# Patient Record
Sex: Male | Born: 1965 | Hispanic: No | Marital: Married | Smoking: Never smoker
Health system: Southern US, Community
[De-identification: ages and names within clinical notes are randomized; demographics above are authoritative.]

## PROBLEM LIST (undated history)

## (undated) DIAGNOSIS — E7800 Pure hypercholesterolemia, unspecified: Secondary | ICD-10-CM

## (undated) DIAGNOSIS — I10 Essential (primary) hypertension: Secondary | ICD-10-CM

---

## 2008-01-03 ENCOUNTER — Ambulatory Visit (HOSPITAL_COMMUNITY): Admission: RE | Admit: 2008-01-03 | Discharge: 2008-01-03 | Payer: Self-pay | Admitting: Family Medicine

## 2009-07-01 IMAGING — CT CT ABDOMEN W/O CM
2 of 4 series · 17 of 46 positions shown, 19 images · non-contrast
Comparison: None

CT ABDOMEN

CLINICAL DATA: Abdominal pain.  Acute right flank pain.  Hematuria

CT ABDOMEN AND PELVIS WITHOUT CONTRAST
TECHNIQUE: Multidetector CT imaging of the abdomen and pelvis was
performed following the standard
protocol without intravenous contrast.

[Series 2: renal stone · axial · 0.87mm/px · z∈[-474,-24]mm · 14 of 96 slices shown, 16 images]
[im 4/96  soft-tissue]
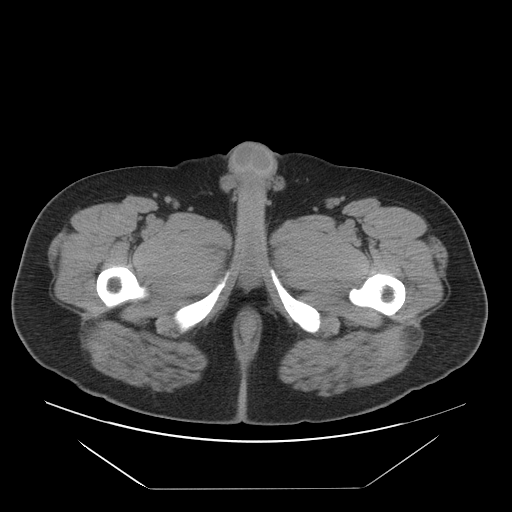
[im 4/96  bone]
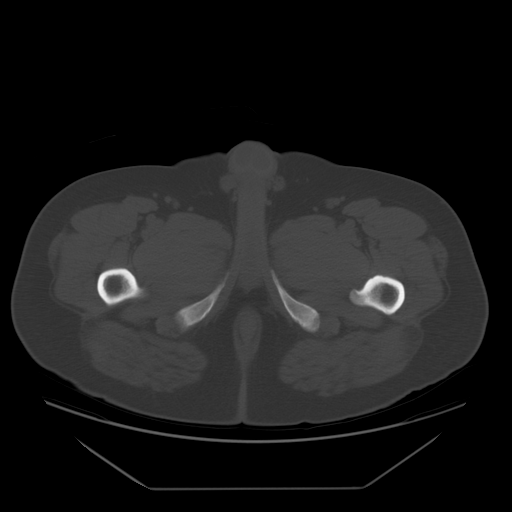
[im 12/96  soft-tissue]
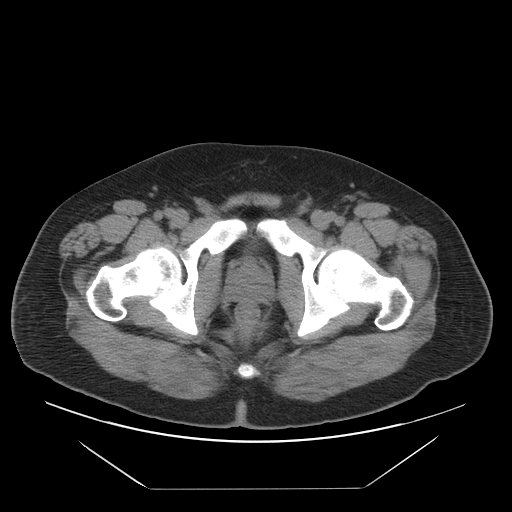
[im 20/96  soft-tissue]
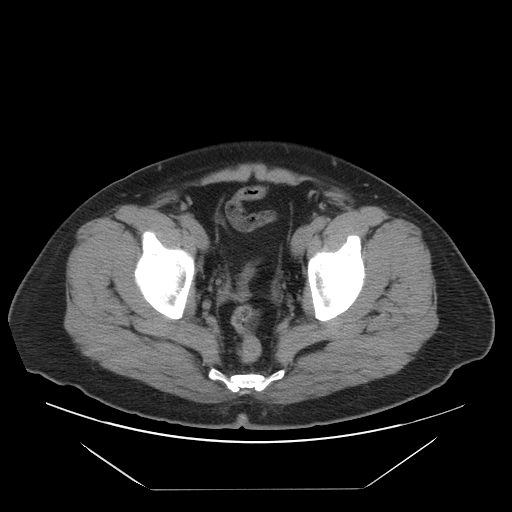
[im 24/96  soft-tissue]
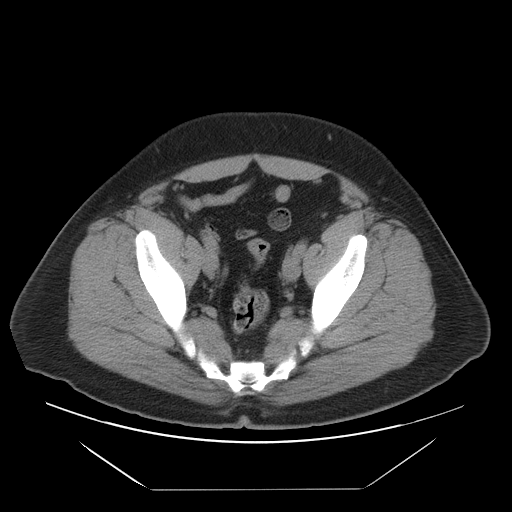
[im 32/96  soft-tissue]
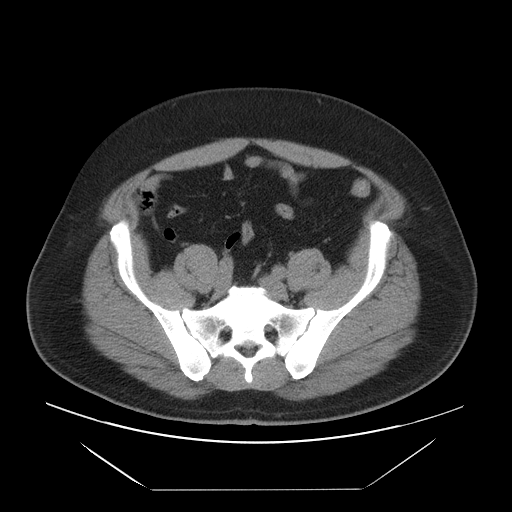
[im 40/96  soft-tissue]
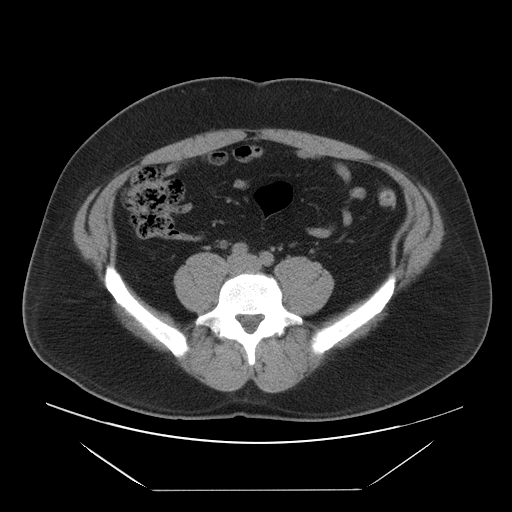
[im 44/96  soft-tissue]
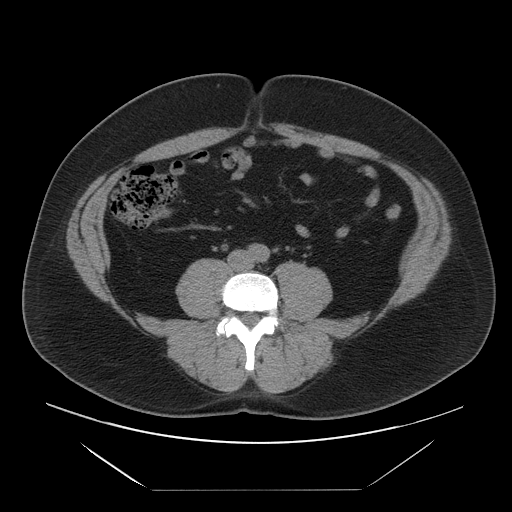
[im 52/96  soft-tissue]
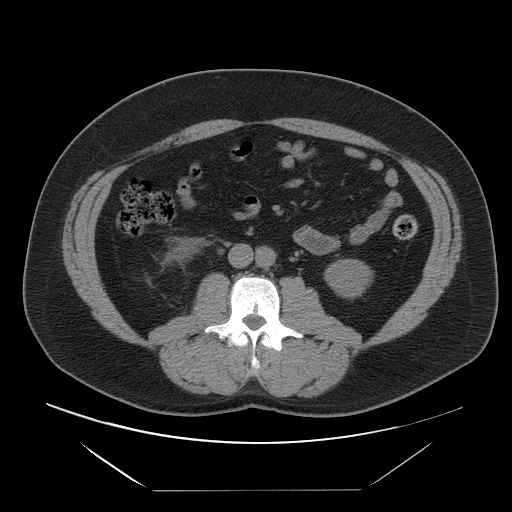
[im 56/96  soft-tissue]
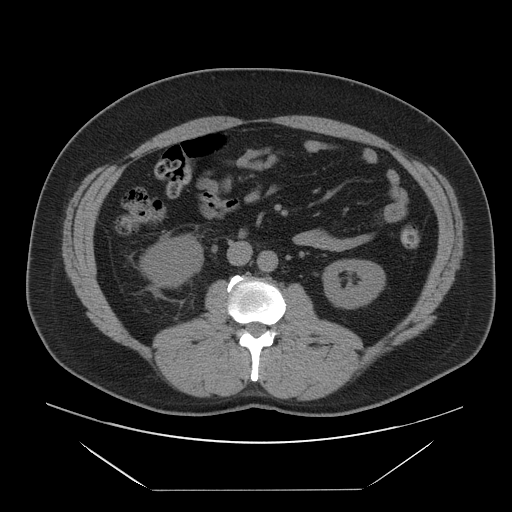
[im 56/96  bone]
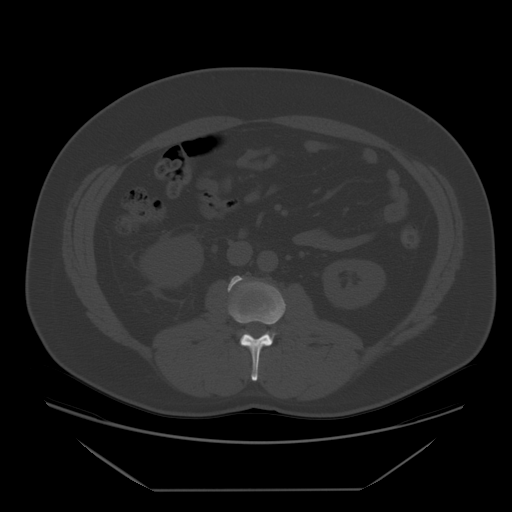
[im 64/96  soft-tissue]
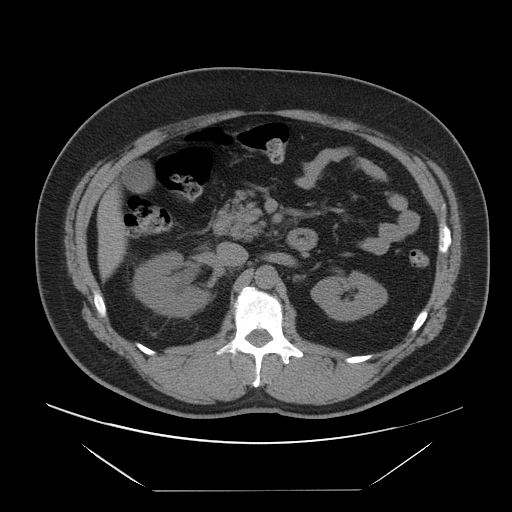
[im 72/96  soft-tissue]
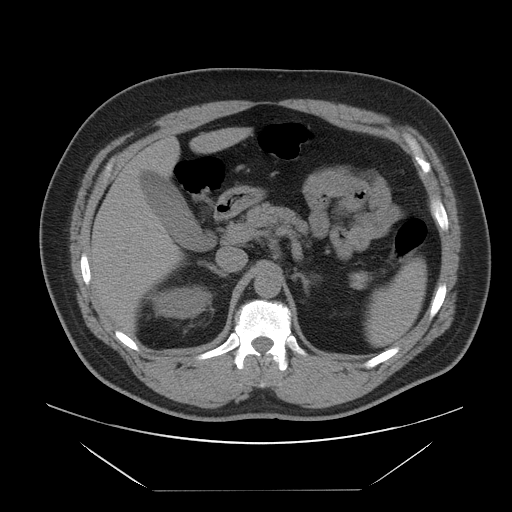
[im 76/96  soft-tissue]
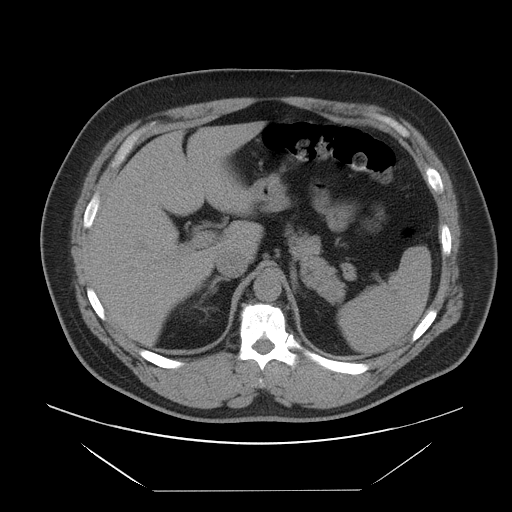
[im 84/96  soft-tissue]
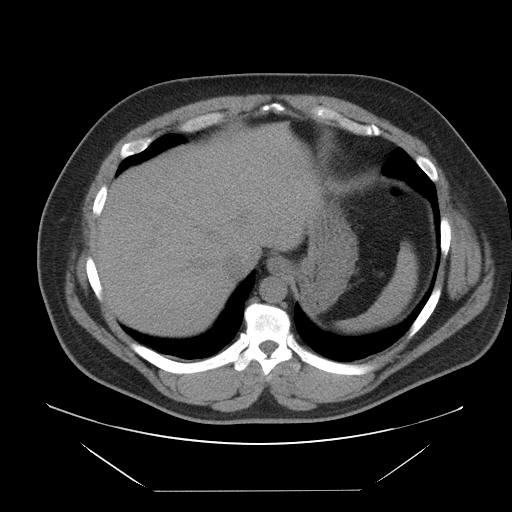
[im 92/96  soft-tissue]
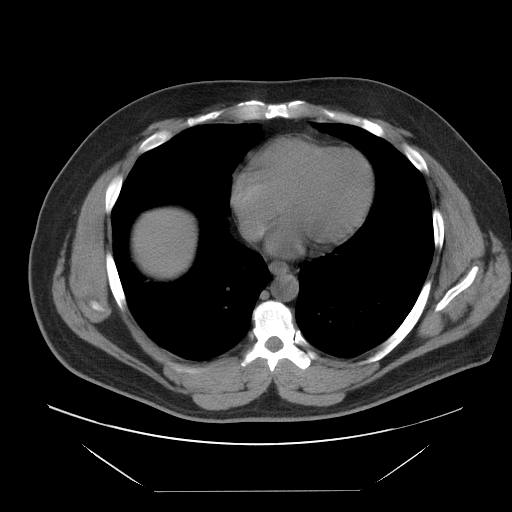

[Series 400: cor a/p · coronal · 0.96mm/px · 3 of 104 slices shown]
[im 35/104  soft-tissue]
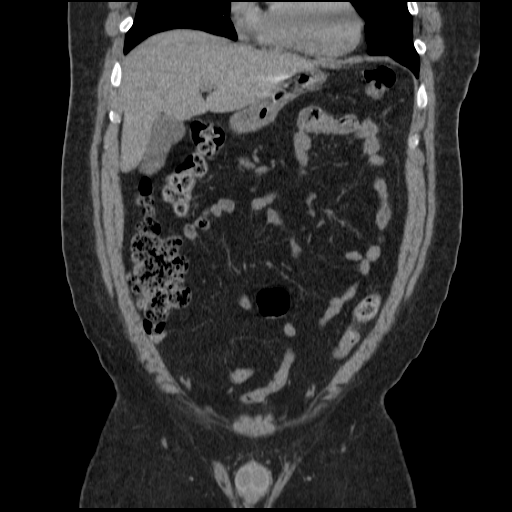
[im 46/104  soft-tissue]
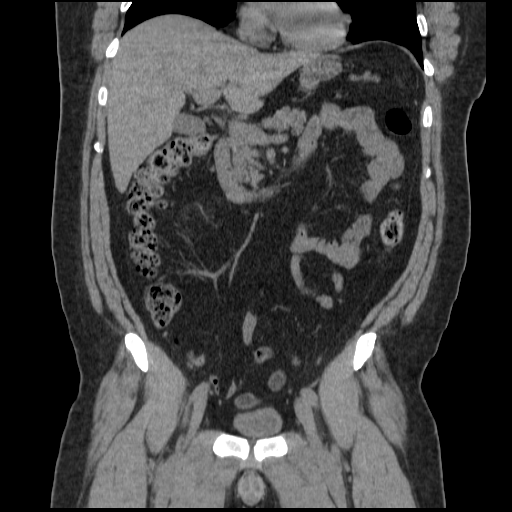
[im 58/104  soft-tissue]
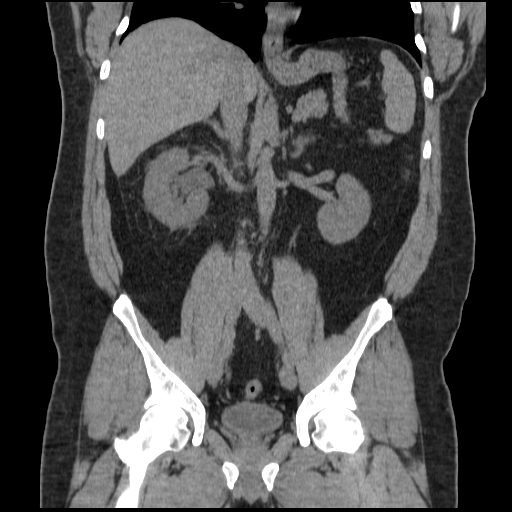

[17 of 46 positions shown; findings below may reference images not displayed]

FINDINGS: Visualized lung bases are clear.

There is mild right hydroureteronephrosis.  Perinephric stranding
is present on the right.

No intra renal or ureteral calculi are identified in either the
right or left PICC tract.

The unfused appearance of the liver, gallbladder pancreas adrenal
glands is within normal limits.  The stomach and small bowel are
unremarkable in appearance.  No lymphadenopathy.  The abdominal
aorta contains a small focus of atherosclerotic calcification, and
is normal in caliber.

There are mild degenerative changes of the thoracolumbar spine with
anterior osteophyte formation.  Vertebral body heights and disc
spaces are within normal limits.
IMPRESSION: Mild right hydroureteronephrosis and right perinephric stranding.
No right urinary tract stone is identified.  Question if there is
been recent interval passage of a stone.  Suggest correlation with
urinalysis to exclude urinary tract infection, given the presence
of the perinephric stranding.

CT PELVIS
FINDINGS: The appendix is normal.

There is mild asymmetric dilatation of the right ureter compared to
the left.  No distal right ureteral, urinary bladder, or urethral
stone is identified.  Pelvic bowel loops are normal in caliber.
The seminal vesicles and prostate gland are unremarkable.  Sigmoid
colon/rectum unremarkable.  No free fluid or adenopathy.  No acute
bony findings.
IMPRESSION: 1. Mild asymmetric dilatation of the right ureter.  No urinary
tract calculus or other cause for obstruction identified on non-
contrast CT.  Question if there may have been recent stone passage.
2.  Normal appendix.

## 2014-03-17 ENCOUNTER — Encounter (HOSPITAL_COMMUNITY): Payer: Self-pay | Admitting: Emergency Medicine

## 2014-03-17 ENCOUNTER — Emergency Department (HOSPITAL_COMMUNITY)
Admission: EM | Admit: 2014-03-17 | Discharge: 2014-03-17 | Disposition: A | Discharge status: Home / Self Care | Payer: 59 | Attending: Emergency Medicine | Admitting: Emergency Medicine

## 2014-03-17 ENCOUNTER — Emergency Department (HOSPITAL_COMMUNITY): Payer: 59

## 2014-03-17 DIAGNOSIS — R079 Chest pain, unspecified: Secondary | ICD-10-CM | POA: Diagnosis present

## 2014-03-17 DIAGNOSIS — R05 Cough: Secondary | ICD-10-CM | POA: Insufficient documentation

## 2014-03-17 DIAGNOSIS — Z8639 Personal history of other endocrine, nutritional and metabolic disease: Secondary | ICD-10-CM | POA: Insufficient documentation

## 2014-03-17 DIAGNOSIS — R059 Cough, unspecified: Secondary | ICD-10-CM

## 2014-03-17 DIAGNOSIS — I10 Essential (primary) hypertension: Secondary | ICD-10-CM | POA: Insufficient documentation

## 2014-03-17 HISTORY — DX: Pure hypercholesterolemia, unspecified: E78.00

## 2014-03-17 HISTORY — DX: Essential (primary) hypertension: I10

## 2014-03-17 LAB — BASIC METABOLIC PANEL
Anion gap: 12 (ref 5–15)
BUN: 15 mg/dL (ref 6–23)
CALCIUM: 9.8 mg/dL (ref 8.4–10.5)
CHLORIDE: 102 meq/L (ref 96–112)
CO2: 25 meq/L (ref 19–32)
CREATININE: 1.17 mg/dL (ref 0.50–1.35)
GFR calc Af Amer: 84 mL/min — ABNORMAL LOW (ref >90)
GFR calc non Af Amer: 72 mL/min — ABNORMAL LOW (ref >90)
GLUCOSE: 116 mg/dL — AB (ref 70–99)
Potassium: 4.1 mEq/L (ref 3.7–5.3)
Sodium: 139 mEq/L (ref 137–147)

## 2014-03-17 LAB — CBC WITH DIFFERENTIAL/PLATELET
BASOS ABS: 0 10*3/uL (ref 0.0–0.1)
Basophils Relative: 0 % (ref 0–1)
EOS PCT: 3 % (ref 0–5)
Eosinophils Absolute: 0.2 10*3/uL (ref 0.0–0.7)
HEMATOCRIT: 43.1 % (ref 39.0–52.0)
HEMOGLOBIN: 14.6 g/dL (ref 13.0–17.0)
LYMPHS PCT: 29 % (ref 12–46)
Lymphs Abs: 2.4 10*3/uL (ref 0.7–4.0)
MCH: 29.1 pg (ref 26.0–34.0)
MCHC: 33.9 g/dL (ref 30.0–36.0)
MCV: 85.9 fL (ref 78.0–100.0)
MONO ABS: 0.6 10*3/uL (ref 0.1–1.0)
MONOS PCT: 7 % (ref 3–12)
Neutro Abs: 4.9 10*3/uL (ref 1.7–7.7)
Neutrophils Relative %: 61 % (ref 43–77)
Platelets: 218 10*3/uL (ref 150–400)
RBC: 5.02 MIL/uL (ref 4.22–5.81)
RDW: 13.1 % (ref 11.5–15.5)
WBC: 8.1 10*3/uL (ref 4.0–10.5)

## 2014-03-17 LAB — I-STAT TROPONIN, ED
TROPONIN I, POC: 0.01 ng/mL (ref 0.00–0.08)
Troponin i, poc: 0 ng/mL (ref 0.00–0.08)

## 2014-03-17 MED ORDER — ASPIRIN 81 MG PO CHEW
324.0000 mg | CHEWABLE_TABLET | Freq: Once | ORAL | Status: AC
  Administered 2014-03-17: 324 mg via ORAL
  Filled 2014-03-17: qty 4

## 2014-03-17 NOTE — ED Notes (Signed)
Made apt with cardiology per Dr. Bebe ShaggyWickline!

## 2014-03-17 NOTE — ED Provider Notes (Signed)
CSN: [id number]     Arrival date & time 03/17/14  [id number] History   First MD Initiated Contact with Patient 03/17/14 1007     Chief Complaint  Patient presents with  . Chest Pain      Patient is a 48 y.o. male presenting with chest pain. The history is provided by the patient.  Chest Pain Pain location:  R chest and L chest Pain quality: tightness   Pain radiates to:  Does not radiate Pain radiates to the back: no   Pain severity:  Mild Onset quality:  Gradual Duration:  3 days Timing:  Intermittent Progression:  Improving Chronicity:  New Relieved by:  None tried Worsened by:  Nothing tried Associated symptoms: cough   Associated symptoms: no abdominal pain, no dizziness, no fever, no lower extremity edema, no nausea, no shortness of breath, no syncope and no weakness   Pt reports for 3 days he has had intermittent episodes of CP - at times on right side, other times on left side.  He reports mild tightness.  Does not radiate to back/neck/arms.  No associated fatigue/sob/weakness/vomiting.  No LE edema. No pleuritic pain.  He reports he works out frequently without any weakness/fatigue/SOB. No fatigue/weakness/SOB doing daily activities. He reports one brief episode of right sided CP yesterday while on treadmill but no left side CP.  He had no other symptoms at that time.  He is active without any limitations.  The pain comes at random, can occur at rest and he reports increased belching.  He reports CP can vary with duration, sometimes only a few minutes at a time  Past Medical History  Diagnosis Date  . Hypertension   . Hypercholesteremia    History reviewed. No pertinent past surgical history. History reviewed. No pertinent family history. History  Substance Use Topics  . Smoking status: Never Smoker   . Smokeless tobacco: Not on file  . Alcohol Use: Yes     Comment: occ    Review of Systems  Constitutional: Negative for fever.  Respiratory: Positive for cough. Negative  for shortness of breath.   Cardiovascular: Positive for chest pain. Negative for leg swelling and syncope.  Gastrointestinal: Negative for nausea and abdominal pain.  Neurological: Negative for dizziness and weakness.  All other systems reviewed and are negative.     Allergies  Review of patient's allergies indicates no known allergies.  Home Medications   Prior to Admission medications   Not on File   BP 148/93 mmHg  Pulse 78  Temp(Src) 97.8 F (36.6 C) (Oral)  Resp 18  SpO2 99% Physical Exam CONSTITUTIONAL: Well developed/well nourished HEAD: Normocephalic/atraumatic EYES: EOMI/PERRL ENMT: Mucous membranes moist NECK: supple no meningeal signs SPINE:entire spine nontender CV: S1/S2 noted, no murmurs/rubs/gallops noted LUNGS: Lungs are clear to auscultation bilaterally, no apparent distress ABDOMEN: soft, nontender, no rebound or guarding GU:no cva tenderness NEURO: Pt is awake/alert, moves all extremitiesx4 EXTREMITIES: pulses normal, full ROM, no LE edema SKIN: warm, color normal PSYCH: no abnormalities of mood noted  ED Course  Procedures   10:37 AM HEART score = 3 (family history/HTN/HLP/obesity) He does not smoke and denies h/o DM His story is atypical and low suspicion for ACS I doubt PE/Dissection I spoke to PA Manon HildingJessica Mauney at [facility] who sent patient to the ER They were concerned with his h/o CP, family history and abnormal EKG EKG from office shows significant artifact which limits interpretation Current EKG does not reveal any ischemic findings PCP will arrange for  cardiology outpatient evaluation 12:56 PM Pt without any recurrent CP I had long discussion with patient/wife that given history/physical, will be appropriate for outpatient management We did agree to arrange followup.  Cardiology followup made for 11/25 at 1445 Patient/family agreeable with plan 3:32 PM Repeat troponin/EKG unchanged Pt has not had any symptoms while in the  ED Discussed strict return precautions with patient/family Appropriate for d/c home   Labs Review Labs Reviewed  BASIC METABOLIC PANEL - Abnormal; Notable for the following:    Glucose, Bld 116 (*)    GFR calc non Af Amer 72 (*)    GFR calc Af Amer 84 (*)    All other components within normal limits  CBC WITH DIFFERENTIAL  Rosezena SensorI-STAT TROPOININ, ED    Imaging Review Dg Chest 2 View  03/17/2014   CLINICAL DATA:  Chest tightness and dry cough.  EXAM: CHEST  2 VIEW  COMPARISON:  None.  FINDINGS: No cardiomegaly.  Negative aortic and hilar contours.  There is no edema, consolidation, effusion, or pneumothorax.  IMPRESSION: No active cardiopulmonary disease.   Electronically Signed   By: Tiburcio PeaJonathan  Watts M.D.   On: 03/17/2014 10:41        Date: 03/17/2014 0957am  Rate: 83  Rhythm: normal sinus rhythm  QRS Axis: normal  Intervals: normal  ST/T Wave abnormalities: normal  Conduction Disutrbances:none    EKG Interpretation  Date/Time:  Thursday March 17 2014 14:11:33 EST Ventricular Rate:  68 PR Interval:  188 QRS Duration: 97 QT Interval:  405 QTC Calculation: 431 R Axis:   37 Text Interpretation:  Sinus rhythm Consider left atrial enlargement No significant change was found Confirmed by Bebe ShaggyWICKLINE  MD, Dorinda HillNALD ([id number]) on 03/17/2014 2:34:43 PM         MDM   Final diagnoses:  Cough  Chest pain, unspecified chest pain type    Nursing notes including past medical history and social history reviewed and considered in documentation xrays reviewed and considered Labs/vital reviewed and considered      Joya Gaskinsonald W Dawnna Gritz, MD 03/17/14 1533

## 2014-03-17 NOTE — Discharge Instructions (Signed)

## 2014-03-17 NOTE — ED Notes (Signed)
Pt c/o chest tightness with dry cough x 3 days that is intermittent in nature; pt sts tightness moves around to different areas of chest; pt sts hx of bronchitis in past

## 2014-03-30 ENCOUNTER — Ambulatory Visit (INDEPENDENT_AMBULATORY_CARE_PROVIDER_SITE_OTHER): Payer: 59 | Admitting: Interventional Cardiology

## 2014-03-30 ENCOUNTER — Encounter: Payer: Self-pay | Admitting: Interventional Cardiology

## 2014-03-30 VITALS — BP 130/82 | HR 65 | Ht 66.0 in | Wt 234.4 lb

## 2014-03-30 DIAGNOSIS — R079 Chest pain, unspecified: Secondary | ICD-10-CM

## 2014-03-30 DIAGNOSIS — E663 Overweight: Secondary | ICD-10-CM

## 2014-03-30 DIAGNOSIS — E785 Hyperlipidemia, unspecified: Secondary | ICD-10-CM

## 2014-03-30 DIAGNOSIS — I10 Essential (primary) hypertension: Secondary | ICD-10-CM

## 2014-03-30 NOTE — Patient Instructions (Signed)
Your physician recommends that you continue on your current medications as directed. Please refer to the Current Medication list given to you today.  Your physician has requested that you have an exercise tolerance test. For further information please visit [email or web address]. Please also follow instruction sheet, as given.  No follow up is needed at this time with Dr. Eldridge DaceVaranasi. He will see you on an as needed basis.

## 2014-03-30 NOTE — Progress Notes (Signed)
Patient ID: Jeffrey ProwsVictor [name], male   DOB: [birth year], 48 y.o.   [name]: [id number]     Patient ID: Jeffrey [name]: [id number] DOB/AGE: [birth year] 48 y.o.   Ref[facility] Physician Dr. Clarice PoleWickline[facility]   Reason for Consultation chest pain  HPI: 48 y/o who had been in his [facility].  He bench pressed and felt some pulling in his center chest.  A few days later, he felt a tightness in the center of his chest.  Because he was going out of town, he went to urgent care to get checked out.  He had a negative workup.  He is here for further evaluation.  His BP adn lipids has been controlled.  The only pain left is in the right side of his chest and it feels superficial.  It occurs with moving too quickly.  No problems with walking.  He does the treadmill several times a week.  No CP or SHOB since going to the ER.  He has been back to gym since the ER visit.  He avoided the bench press that caused his chest pain.  He feels some acid reflux; known hiatal hernia.   Current Outpatient Prescriptions  Medication Sig Dispense Refill  . atorvastatin (LIPITOR) 20 MG tablet Take 20 mg by mouth daily.    . Cholecalciferol (VITAMIN D) 2000 UNITS tablet Take 2,000 Units by mouth daily.    Marland Kitchen. losartan-hydrochlorothiazide (HYZAAR) 50-12.5 MG per tablet Take 1 tablet by mouth daily.    . Zinc 50 MG CAPS Take 1 capsule by mouth daily.     No current facility-administered medications for this visit.   Past Medical History  Diagnosis Date  . Hypertension   . Hypercholesteremia     No family history on file.  History   Social History  . Marital Status: Unknown    Spouse Name: N/A    Number of Children: N/A  . Years of Education: N/A   Occupational History  . Not on file.   Social History Main Topics  . Smoking status: Never Smoker   . Smokeless tobacco: Not on file  . Alcohol Use: Yes     Comment: occ  . Drug Use: No  . Sexual Activity: Not on file   Other Topics Concern  . Not on  file   Social History Narrative    No past surgical history on file.    (Not in a hospital admission)  Review of systems complete and found to be negative unless listed above .  No nausea, vomiting.  No fever chills, No focal weakness.  Reports Some GERD sx as well.  No palpitations.  Physical Exam: Filed Vitals:   03/30/14 1507  BP: 130/82  Pulse: 65    Weight: 234 lb 6.4 oz (106.323 kg)  Physical exam:  [place]/AT EOMI No JVD, No carotid bruit RRR S1S2  No wheezing Soft. NT, nondistended, obese No edema. No focal motor or sensory deficits Normal affect  Labs:   Lab Results  Component Value Date   WBC 8.1 03/17/2014   HGB 14.6 03/17/2014   HCT 43.1 03/17/2014   MCV 85.9 03/17/2014   PLT 218 03/17/2014   No results for input(s): NA, K, CL, CO2, BUN, CREATININE, CALCIUM, PROT, BILITOT, ALKPHOS, ALT, AST, GLUCOSE in the last 168 hours.  Invalid input(s): LABALBU No results found for: CKTOTAL, CKMB, CKMBINDEX, TROPONINI No results found for: CHOL No results found for: HDL No results found for: LDLCALC No results found for: TRIG  No results found for: CHOLHDL No results found for: LDLDIRECT     XLK:GMWNUUEKG:Normal  ASSESSMENT AND PLAN:  1) Atypical chest pain: Sounds more like a muscle strain.  He also feels some indigestion.  Will obtain ETT to evaluate cardiac function at this time. I doubt he has severe CAD at this time.   2) HTN) Controlled today.  Continue current meds.    3) Hyperlipidemia: He was unable to control  Lipids with lifestyle changes alone.  Atorvastatin was recently started.   Obesity: Recommended weight loss.  He is joining [employer] wat[employer].    RF modification. Signed:   Fredric MareJay S. Marquelle Balow, MD, [facility] 03/30/2014, 3:20 PM

## 2014-05-03 ENCOUNTER — Telehealth (HOSPITAL_COMMUNITY): Payer: Self-pay

## 2014-05-03 NOTE — Telephone Encounter (Signed)
Encounter complete. 

## 2014-05-04 ENCOUNTER — Telehealth (HOSPITAL_COMMUNITY): Payer: Self-pay | Admitting: *Deleted

## 2014-05-05 ENCOUNTER — Ambulatory Visit (HOSPITAL_COMMUNITY)
Admission: RE | Admit: 2014-05-05 | Discharge: 2014-05-05 | Disposition: A | Discharge status: Home / Self Care | Payer: 59 | Source: Ambulatory Visit | Attending: Interventional Cardiology | Admitting: Interventional Cardiology

## 2014-05-05 DIAGNOSIS — R079 Chest pain, unspecified: Secondary | ICD-10-CM

## 2014-05-05 NOTE — Procedures (Signed)
Exercise Treadmill Test  Pre-Exercise Testing Evaluation Rhythm: normal sinus  Rate: 75   PR:  .14 QRS:  .06  QT:  .40   ST Segments:  no significant ST changes at rest     Test  Exercise Tolerance Test Ordering MD: Lendell Caprice, MD    Unique Test No: 1 Treadmill:  1  Indication for ETT: chest pain - rule out ischemia  Contraindication to ETT: No   Stress Modality: exercise - treadmill  Cardiac Imaging Performed: non   Protocol: standard Bruce - maximal  Max BP:  215/86  Max MPHR (bpm):  172 85% MPR (bpm):  146  MPHR obtained (bpm):  171 % MPHR obtained:  99  Reached 85% MPHR (min:sec):  8:20 Total Exercise Time (min-sec):  10:30  Workload in METS:  12.5 Borg Scale: 16  Reason ETT Terminated:  SOB, Knee discomfort    ST Segment Analysis At Rest: normal ST segments - no evidence of significant ST depression With Exercise: no evidence of significant ST depression  Other Information Arrhythmia:  No Angina during ETT:  absent (0) Quality of ETT:  diagnostic  ETT Interpretation:  normal - no evidence of ischemia by ST analysis  Comments: Normal GXT with patient exercising to a 12.5 met workload. No chest pain or ECG changes of ischemia. Exaggerated BP response at 215/86 at peak stress. Duke Treadmill score: 12

## 2015-06-08 ENCOUNTER — Other Ambulatory Visit (HOSPITAL_COMMUNITY): Payer: Self-pay | Admitting: Urology

## 2015-06-08 DIAGNOSIS — N5319 Other ejaculatory dysfunction: Secondary | ICD-10-CM

## 2015-06-19 ENCOUNTER — Ambulatory Visit (HOSPITAL_COMMUNITY)
Admission: RE | Admit: 2015-06-19 | Discharge: 2015-06-19 | Disposition: A | Discharge status: Home / Self Care | Payer: BLUE CROSS/BLUE SHIELD | Source: Ambulatory Visit | Attending: Urology | Admitting: Urology

## 2015-06-19 DIAGNOSIS — N5319 Other ejaculatory dysfunction: Secondary | ICD-10-CM

## 2015-06-21 ENCOUNTER — Ambulatory Visit (HOSPITAL_COMMUNITY)
Admission: RE | Admit: 2015-06-21 | Discharge: 2015-06-21 | Disposition: A | Discharge status: Home / Self Care | Payer: BLUE CROSS/BLUE SHIELD | Source: Ambulatory Visit | Attending: Urology | Admitting: Urology

## 2015-06-21 DIAGNOSIS — N5319 Other ejaculatory dysfunction: Secondary | ICD-10-CM | POA: Insufficient documentation

## 2015-06-21 MED ORDER — GADOBENATE DIMEGLUMINE 529 MG/ML IV SOLN
20.0000 mL | Freq: Once | INTRAVENOUS | Status: AC | PRN
  Administered 2015-06-21: 16 mL via INTRAVENOUS

## 2015-09-29 DIAGNOSIS — E291 Testicular hypofunction: Secondary | ICD-10-CM | POA: Diagnosis not present

## 2015-11-22 DIAGNOSIS — E7800 Pure hypercholesterolemia, unspecified: Secondary | ICD-10-CM | POA: Diagnosis not present

## 2015-11-22 DIAGNOSIS — I10 Essential (primary) hypertension: Secondary | ICD-10-CM | POA: Diagnosis not present

## 2015-11-22 DIAGNOSIS — G4700 Insomnia, unspecified: Secondary | ICD-10-CM | POA: Diagnosis not present

## 2015-11-29 DIAGNOSIS — E291 Testicular hypofunction: Secondary | ICD-10-CM | POA: Diagnosis not present

## 2016-02-08 DIAGNOSIS — D1801 Hemangioma of skin and subcutaneous tissue: Secondary | ICD-10-CM | POA: Diagnosis not present

## 2016-02-08 DIAGNOSIS — D225 Melanocytic nevi of trunk: Secondary | ICD-10-CM | POA: Diagnosis not present

## 2016-02-08 DIAGNOSIS — L814 Other melanin hyperpigmentation: Secondary | ICD-10-CM | POA: Diagnosis not present

## 2016-02-08 DIAGNOSIS — L821 Other seborrheic keratosis: Secondary | ICD-10-CM | POA: Diagnosis not present

## 2016-05-20 DIAGNOSIS — I10 Essential (primary) hypertension: Secondary | ICD-10-CM | POA: Diagnosis not present

## 2016-05-20 DIAGNOSIS — E7800 Pure hypercholesterolemia, unspecified: Secondary | ICD-10-CM | POA: Diagnosis not present

## 2016-05-21 DIAGNOSIS — M5136 Other intervertebral disc degeneration, lumbar region: Secondary | ICD-10-CM | POA: Diagnosis not present

## 2016-05-21 DIAGNOSIS — M9903 Segmental and somatic dysfunction of lumbar region: Secondary | ICD-10-CM | POA: Diagnosis not present

## 2016-05-21 DIAGNOSIS — M545 Low back pain: Secondary | ICD-10-CM | POA: Diagnosis not present

## 2016-05-21 DIAGNOSIS — M62830 Muscle spasm of back: Secondary | ICD-10-CM | POA: Diagnosis not present

## 2016-05-27 DIAGNOSIS — E7800 Pure hypercholesterolemia, unspecified: Secondary | ICD-10-CM | POA: Diagnosis not present

## 2016-05-27 DIAGNOSIS — G4700 Insomnia, unspecified: Secondary | ICD-10-CM | POA: Diagnosis not present

## 2016-05-27 DIAGNOSIS — E291 Testicular hypofunction: Secondary | ICD-10-CM | POA: Diagnosis not present

## 2016-05-27 DIAGNOSIS — I10 Essential (primary) hypertension: Secondary | ICD-10-CM | POA: Diagnosis not present

## 2016-05-28 DIAGNOSIS — E291 Testicular hypofunction: Secondary | ICD-10-CM | POA: Diagnosis not present

## 2016-05-28 DIAGNOSIS — N5201 Erectile dysfunction due to arterial insufficiency: Secondary | ICD-10-CM | POA: Diagnosis not present

## 2016-09-13 DIAGNOSIS — E291 Testicular hypofunction: Secondary | ICD-10-CM | POA: Diagnosis not present

## 2016-09-18 DIAGNOSIS — H5203 Hypermetropia, bilateral: Secondary | ICD-10-CM | POA: Diagnosis not present

## 2016-09-18 DIAGNOSIS — H52221 Regular astigmatism, right eye: Secondary | ICD-10-CM | POA: Diagnosis not present

## 2016-09-18 DIAGNOSIS — H524 Presbyopia: Secondary | ICD-10-CM | POA: Diagnosis not present

## 2016-10-09 DIAGNOSIS — N5201 Erectile dysfunction due to arterial insufficiency: Secondary | ICD-10-CM | POA: Diagnosis not present

## 2016-10-09 DIAGNOSIS — E291 Testicular hypofunction: Secondary | ICD-10-CM | POA: Diagnosis not present

## 2016-11-22 DIAGNOSIS — E7800 Pure hypercholesterolemia, unspecified: Secondary | ICD-10-CM | POA: Diagnosis not present

## 2016-11-22 DIAGNOSIS — I10 Essential (primary) hypertension: Secondary | ICD-10-CM | POA: Diagnosis not present

## 2016-11-25 DIAGNOSIS — E7800 Pure hypercholesterolemia, unspecified: Secondary | ICD-10-CM | POA: Diagnosis not present

## 2016-11-25 DIAGNOSIS — I10 Essential (primary) hypertension: Secondary | ICD-10-CM | POA: Diagnosis not present

## 2016-11-25 DIAGNOSIS — N289 Disorder of kidney and ureter, unspecified: Secondary | ICD-10-CM | POA: Diagnosis not present

## 2016-11-25 DIAGNOSIS — G4700 Insomnia, unspecified: Secondary | ICD-10-CM | POA: Diagnosis not present

## 2017-02-13 DIAGNOSIS — L570 Actinic keratosis: Secondary | ICD-10-CM | POA: Diagnosis not present

## 2017-02-13 DIAGNOSIS — Z23 Encounter for immunization: Secondary | ICD-10-CM | POA: Diagnosis not present

## 2017-02-13 DIAGNOSIS — D1801 Hemangioma of skin and subcutaneous tissue: Secondary | ICD-10-CM | POA: Diagnosis not present

## 2017-02-13 DIAGNOSIS — L821 Other seborrheic keratosis: Secondary | ICD-10-CM | POA: Diagnosis not present

## 2017-02-13 DIAGNOSIS — L814 Other melanin hyperpigmentation: Secondary | ICD-10-CM | POA: Diagnosis not present

## 2017-05-28 DIAGNOSIS — I10 Essential (primary) hypertension: Secondary | ICD-10-CM | POA: Diagnosis not present

## 2017-05-28 DIAGNOSIS — E7800 Pure hypercholesterolemia, unspecified: Secondary | ICD-10-CM | POA: Diagnosis not present

## 2017-05-28 DIAGNOSIS — G4700 Insomnia, unspecified: Secondary | ICD-10-CM | POA: Diagnosis not present

## 2017-07-18 ENCOUNTER — Emergency Department (HOSPITAL_COMMUNITY)
Admission: EM | Admit: 2017-07-18 | Discharge: 2017-07-18 | Disposition: A | Discharge status: Home / Self Care | Payer: BLUE CROSS/BLUE SHIELD | Attending: Emergency Medicine | Admitting: Emergency Medicine

## 2017-07-18 ENCOUNTER — Emergency Department (HOSPITAL_COMMUNITY): Payer: BLUE CROSS/BLUE SHIELD

## 2017-07-18 ENCOUNTER — Encounter (HOSPITAL_COMMUNITY): Payer: Self-pay | Admitting: *Deleted

## 2017-07-18 DIAGNOSIS — I10 Essential (primary) hypertension: Secondary | ICD-10-CM | POA: Diagnosis not present

## 2017-07-18 DIAGNOSIS — R001 Bradycardia, unspecified: Secondary | ICD-10-CM | POA: Diagnosis not present

## 2017-07-18 DIAGNOSIS — Z79899 Other long term (current) drug therapy: Secondary | ICD-10-CM | POA: Diagnosis not present

## 2017-07-18 DIAGNOSIS — R55 Syncope and collapse: Secondary | ICD-10-CM | POA: Diagnosis not present

## 2017-07-18 LAB — BASIC METABOLIC PANEL
ANION GAP: 11 (ref 5–15)
BUN: 27 mg/dL — ABNORMAL HIGH (ref 6–20)
CALCIUM: 9.3 mg/dL (ref 8.9–10.3)
CO2: 25 mmol/L (ref 22–32)
Chloride: 103 mmol/L (ref 101–111)
Creatinine, Ser: 1.28 mg/dL — ABNORMAL HIGH (ref 0.61–1.24)
GFR calc Af Amer: >60 mL/min (ref >60)
GFR calc non Af Amer: >60 mL/min (ref >60)
GLUCOSE: 140 mg/dL — AB (ref 65–99)
Potassium: 4.5 mmol/L (ref 3.5–5.1)
Sodium: 139 mmol/L (ref 135–145)

## 2017-07-18 LAB — URINALYSIS, ROUTINE W REFLEX MICROSCOPIC
BILIRUBIN URINE: NEGATIVE
GLUCOSE, UA: NEGATIVE mg/dL
HGB URINE DIPSTICK: NEGATIVE
Ketones, ur: NEGATIVE mg/dL
Leukocytes, UA: NEGATIVE
Nitrite: NEGATIVE
PH: 5 (ref 5.0–8.0)
Protein, ur: NEGATIVE mg/dL
SPECIFIC GRAVITY, URINE: 1.028 (ref 1.005–1.030)

## 2017-07-18 LAB — I-STAT TROPONIN, ED
TROPONIN I, POC: 0 ng/mL (ref 0.00–0.08)
Troponin i, poc: 0 ng/mL (ref 0.00–0.08)

## 2017-07-18 LAB — CBC
HEMATOCRIT: 41.1 % (ref 39.0–52.0)
HEMOGLOBIN: 14.1 g/dL (ref 13.0–17.0)
MCH: 30.5 pg (ref 26.0–34.0)
MCHC: 34.3 g/dL (ref 30.0–36.0)
MCV: 89 fL (ref 78.0–100.0)
Platelets: 177 10*3/uL (ref 150–400)
RBC: 4.62 MIL/uL (ref 4.22–5.81)
RDW: 13 % (ref 11.5–15.5)
WBC: 9 10*3/uL (ref 4.0–10.5)

## 2017-07-18 LAB — CBG MONITORING, ED: Glucose-Capillary: 116 mg/dL — ABNORMAL HIGH (ref 65–99)

## 2017-07-18 NOTE — ED Notes (Signed)
Pt's wife states that he was c/o of a headache after syncopal episode; pt unable to confirm whether or not he hit his head during syncopal episode

## 2017-07-18 NOTE — ED Notes (Signed)
Pt aware that a urine sample is needed.  

## 2017-07-18 NOTE — ED Provider Notes (Signed)
[facility] EMERGENCY DEPARTMENT Provider Note   CSN: [id number] Arrival date & time: 07/18/17  0757     History   Chief Complaint Chief Complaint  Patient presents with  . Loss of Consciousness    HPI Jeffrey [name] is a 52 y.o. male.  HPI Patient had episode of passing out this morning.  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  He was shaving at the time and passed out.  His wife heard him and went into check on him.  She reports he was staring for several seconds with eyes open and no generalized shaking movements.  She reports that soon as he started to come around he wanted to get back up and start shaving and getting ready for work.  Reports he still was very weak and she was holding him up all trying to call 911 at the same time.  Patient reports he feels fine now he denies he had any preceding chest pain or headache.  He denies head injury.  He denies visual blurring focal weakness numbness or tingling.  At baseline, the patient is very physically active.  He runs several miles at a time 3 times per week.  He denies ever getting problems with chest pain lightheadedness or dyspnea on exertion.  Patient reports he does sometimes take a sleeping pill at night.  Took trazodone yesterday evening and is compliant with his losartan hydrochlorothiazide.  Since the event, patient denies he is experienced any symptoms and feels to be at baseline. Past Medical History:  Diagnosis Date  . Hypercholesteremia   . Hypertension     Patient Active Problem List   Diagnosis Date Noted  . Essential hypertension 03/30/2014  . Overweight 03/30/2014  . Hyperlipidemia 03/30/2014    No past surgical history on file.     Home Medications    Prior to Admission medications   Medication Sig Start Date End Date Taking? Authorizing Provider  Ascorbic Acid (VITAMIN C) 1000 MG tablet Take 1,000 mg by mouth daily.   Yes [provider]  Cholecalciferol  (VITAMIN D) 2000 UNITS tablet Take 2,000 Units by mouth daily.   Yes [provider]  Coenzyme Q10 (COQ10) 30 MG CAPS Take 30 mg by mouth daily.   Yes [provider]  losartan-hydrochlorothiazide (HYZAAR) 50-12.5 MG per tablet Take 1 tablet by mouth daily.   Yes [provider]  Magnesium 500 MG TABS Take 500 mg by mouth daily.   Yes [provider]  traZODone (DESYREL) 50 MG tablet Take 50 mg by mouth at bedtime as needed for sleep.   Yes [provider]  vitamin B-12 (CYANOCOBALAMIN) 500 MCG tablet Take 500 mcg by mouth daily.   Yes [provider]  Zinc 50 MG CAPS Take 1 capsule by mouth daily.   Yes [provider]    Family History Family History  Problem Relation Age of Onset  . High blood pressure Father     Social History Social History   Tobacco Use  . Smoking status: Never Smoker  . Smokeless tobacco: Never Used  Substance Use Topics  . Alcohol use: Yes    Comment: occ  . Drug use: No     Allergies   Patient has no known allergies.   Review of Systems Review of Systems 10 Systems reviewed and are negative for acute change except as noted in the HPI.   Physical Exam Updated Vital Signs BP 119/81   Pulse Marland Kitchen)  50   Temp 98 F (36.7 C) (Oral)   Resp 13   SpO2 99%   Physical Exam  Constitutional: He is oriented to person, place, and time. He appears well-developed and well-nourished.  HENT:  Head: Normocephalic and atraumatic.  Nose: Nose normal.  Mouth/Throat: Oropharynx is clear and moist.  Eyes: Conjunctivae and EOM are normal.  Neck: Neck supple.  Cardiovascular: Normal rate, regular rhythm, normal heart sounds and intact distal pulses.  No murmur heard. Pulmonary/Chest: Effort normal and breath sounds normal. No respiratory distress.  Abdominal: Soft. There is no tenderness.  Musculoskeletal: Normal range of motion. He exhibits no edema, tenderness or deformity.  Neurological: He is  alert and oriented to person, place, and time. No cranial nerve deficit or sensory deficit. He exhibits normal muscle tone. Coordination normal.  Normal cognitive function.  Speech is clear.  Skin: Skin is warm and dry.  Psychiatric: He has a normal mood and affect.  Nursing note and vitals reviewed.    ED Treatments / Results  Labs (all labs ordered are listed, but only abnormal results are displayed) Labs Reviewed  BASIC METABOLIC PANEL - Abnormal; Notable for the following components:      Result Value   Glucose, Bld 140 (*)    BUN 27 (*)    Creatinine, Ser 1.28 (*)    All other components within normal limits  CBG MONITORING, ED - Abnormal; Notable for the following components:   Glucose-Capillary 116 (*)    All other components within normal limits  CBC  URINALYSIS, ROUTINE W REFLEX MICROSCOPIC  I-STAT TROPONIN, ED  I-STAT TROPONIN, ED    EKG  EKG Interpretation  Date/Time:  Friday July 18 2017 08:06:41 EDT Ventricular Rate:  56 PR Interval:  174 QRS Duration: 96 QT Interval:  466 QTC Calculation: 449 R Axis:   61 Text Interpretation:  Sinus bradycardia Otherwise normal ECG no significant change from ols Confirmed by Arby Barrette [phone]) on 07/18/2017 9:14:40 AM       Radiology No results found.  Procedures Procedures (including critical care time)  Medications Ordered in ED Medications - No data to display   Initial Impression / Assessment and Plan / ED Course  I have reviewed the triage vital signs and the nursing notes.  Pertinent labs & imaging results that were available during my care of the patient were reviewed by me and considered in my medical decision making (see chart for details).     Final Clinical Impressions(s) / ED Diagnoses   Final diagnoses:  Syncope and collapse  Bradycardia, sinus   Patient with syncopal episode this morning.  2 sets of cardiac enzymes are normal.  Patient's EKG is for sinus bradycardia.  Patient has normal  neurologic examination without any preceding neurologic symptoms.  This time have very low suspicion for Riemer a neurologic etiology.  Patient did take trazodone at bedtime as well as having very well controlled blood pressure and on medication.  He also is very physically fit and active with what appears to be a baseline heart rate in the 50s.  I suspect patient had episode of orthostatic hypotension this morning.  He is counseled on follow-up with his primary care doctor cardiology to discuss possible placement of continuous monitoring device to determine if patient becomes significantly bradycardic either at rest or remains bradycardic with activity.  He is counseled on return precautions.  He is counseled to pursue only light moderate physical activity until his follow-up has been established. ED Discharge  Orders    None       Arby BarrettePfeiffer, Manika Hast, MD 07/23/17 1537

## 2017-07-18 NOTE — ED Notes (Signed)
Pt given graham crackers, PB, and water per Dr. Donnald GarrePfeiffer

## 2017-07-18 NOTE — Discharge Instructions (Signed)
1.  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  You need to follow-up with your family doctor or cardiologist.  Discuss wearing a continuous monitor so your heart rate can be recorded at all times. 2.  Avoid heavy exertion until you have followed up with your doctor and made the subsequent arrangements for monitoring.  You may proceed with light exercise and stretching.  If at any time you develop chest pain, shortness of breath or lightheadedness.  Stop immediately and seek medical care.

## 2017-07-18 NOTE — ED Triage Notes (Signed)
To ED for eval after having syncopal episode this am while shaving. Wife heard fall and states pt was laying on back staring off- no seizure activity. She states this last aprox 5-10 seconds then pt stood up and started shaving again. Unsteady at this time. No n/v. No cp or sob. EMS came to home for eval and recommended pt come to ED. No complaints now.

## 2017-07-21 DIAGNOSIS — R55 Syncope and collapse: Secondary | ICD-10-CM | POA: Diagnosis not present

## 2017-07-22 ENCOUNTER — Encounter (INDEPENDENT_AMBULATORY_CARE_PROVIDER_SITE_OTHER): Payer: Self-pay

## 2017-07-22 ENCOUNTER — Encounter: Payer: Self-pay | Admitting: Cardiology

## 2017-07-22 ENCOUNTER — Ambulatory Visit (INDEPENDENT_AMBULATORY_CARE_PROVIDER_SITE_OTHER): Payer: BLUE CROSS/BLUE SHIELD | Admitting: Cardiology

## 2017-07-22 VITALS — BP 140/84 | HR 59 | Ht 65.0 in | Wt 166.0 lb

## 2017-07-22 DIAGNOSIS — R55 Syncope and collapse: Secondary | ICD-10-CM | POA: Diagnosis not present

## 2017-07-22 NOTE — Progress Notes (Signed)
07/22/2017 Jeffrey [name]   [birth year]  [id number]  Primary Physician Jarrett Soho, PA-C Primary Cardiologist: New (Discussed with Dr. Ladona Ridgel, DOD)  Reason for Visit/CC: New Pt Evaluation for Syncope  HPI:  Jeffrey [name] is a 52 y.o. male who is being seen today for the evaluation of syncope, at the request of Jarrett Soho, [place].  Pt was evaluated by our office several years ago for chest pain but not seen since 2015. He has an ETT that year which was negative for ischemia.  Past medical history includes hypertension which is treated with medications.  He has a prior history of hyperlipidemia which is now well controlled off of medications through diet and exercise.  No history of diabetes no history of tobacco use.  Father had vascular disease, particularly carotid artery disease but he was also a smoker.   The patient himself reports that he is very active.  He runs 3 miles daily for exercise and denies any limitations with this.  There is no history of exertional chest pain or dyspnea. No prior h/o syncope/ near syncope.   Patient was in his usual state of health until several days ago.  Night of 07/17/17 , the patient took sleeping pills.  He takes trazodone but usually only takes 1 tablet as needed.  However that night, he decided to take 2 sleeping pills, 50 mg each.  The following morning he woke up and went to his bathroom to get ready for work.  He reports he was feeling fine whenever he woke up however shortly after he had a syncopal episode.  His wife heard him fall and went to the bathroom to check on him.  She reports that he was laying on his back with his eyes wide open and was unresponsive for 5-7 seconds.  She called EMS.  On arrival they obtained an EKG which showed sinus bradycardia with a rate of 46 bpm.  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  He denies feeling any palpitations, dyspnea, chest  discomfort or diaphoresis no prior to his syncopal episode.  He was transported to the emergency department for further evaluation.  Blood glucose level was normal.  Basic metabolic panel showed slight renal insufficiency with serum creatinine of 1.28.  BUN 27.  Potassium was within normal limits.  CBC was unremarkable with normal hemoglobin and hematocrit.  UA was negative.   Troponin's were negative x 2.  EKG in the ED showed sinus bradycardia with a heart rate of 56 bpm, consistent with baseline. BP was normal.  No arrhythmias noted on telemetry. CXR negative. The patient was monitored in the ED and had no further syncope.  He was discharged home and instructed to follow-up with his PCP.  His PCP recommended he be evaluated by cardiology.  He is here in clinic today with his wife.  He reports that he has done well since that once episode.  He denies any recurrent syncope or near syncope.  No chest pain or dyspnea.  The patient went on another 3 mile run yesterday and did well without any limitations and no symptoms.  Orthostatic vital signs were checked in clinic today and are negative.  Exam is benign without carotid bruits.  No murmurs heard on exam. BP ok.  He has avoided further use of trazodone.  Current Meds  Medication Sig  . Ascorbic Acid (VITAMIN C) 1000 MG tablet Take 1,000 mg by mouth daily.  Marland Kitchen  Cholecalciferol (VITAMIN D) 2000 UNITS tablet Take 2,000 Units by mouth daily.  . Coenzyme Q10 (COQ10) 30 MG CAPS Take 30 mg by mouth daily.  Marland Kitchen. losartan-hydrochlorothiazide (HYZAAR) 50-12.5 MG per tablet Take 1 tablet by mouth daily.  . Magnesium 500 MG TABS Take 500 mg by mouth daily.  . traZODone (DESYREL) 50 MG tablet Take 50 mg by mouth at bedtime as needed for sleep.  . vitamin B-12 (CYANOCOBALAMIN) 500 MCG tablet Take 500 mcg by mouth daily.  . Zinc 50 MG CAPS Take 1 capsule by mouth daily.   No Known Allergies Past Medical History:  Diagnosis Date  . Hypercholesteremia   . Hypertension     Family History  Problem Relation Age of Onset  . High blood pressure Father    No past surgical history on file. Social History   Socioeconomic History  . Marital status: Married    Spouse name: Not on file  . Number of children: Not on file  . Years of education: Not on file  . Highest education level: Not on file  Social Needs  . Financial resource strain: Not on file  . Food insecurity - worry: Not on file  . Food insecurity - inability: Not on file  . Transportation needs - medical: Not on file  . Transportation needs - non-medical: Not on file  Occupational History  . Not on file  Tobacco Use  . Smoking status: Never Smoker  . Smokeless tobacco: Never Used  Substance and Sexual Activity  . Alcohol use: Yes    Comment: occ  . Drug use: No  . Sexual activity: Not on file  Other Topics Concern  . Not on file  Social History Narrative  . Not on file     Review of Systems: General: negative for chills, fever, night sweats or weight changes.  Cardiovascular: negative for chest pain, dyspnea on exertion, edema, orthopnea, palpitations, paroxysmal nocturnal dyspnea or shortness of breath Dermatological: negative for rash Respiratory: negative for cough or wheezing Urologic: negative for hematuria Abdominal: negative for nausea, vomiting, diarrhea, bright red blood per rectum, melena, or hematemesis Neurologic: negative for visual changes, syncope, or dizziness All other systems reviewed and are otherwise negative except as noted above.   Physical Exam:  Blood pressure 140/84, pulse (!) 59, height 5\' 5"  (1.651 m), weight 166 lb (75.3 kg), SpO2 99 %.  General appearance: alert, cooperative and no distress Neck: no carotid bruit and no JVD Lungs: clear to auscultation bilaterally Heart: regular rate and rhythm, S1, S2 normal, no murmur, click, rub or gallop Extremities: extremities normal, atraumatic, no cyanosis or edema Pulses: 2+ and symmetric Skin: Skin color,  texture, turgor normal. No rashes or lesions Neurologic: Grossly normal  EKG not performed, ED EKG reviewed. Sinus brady, 56 bpm -- personally reviewed   ASSESSMENT AND PLAN:   1.  Syncope: First time occurrence, 07/18/17, in the setting of increased dose of trazodone (used for sleeping aid) the night prior (took 100 mg, usually only takes 50).  This is the only likely trigger that has been identified which may have precipitated his symptoms.  Emergency department evaluation was unremarkable, including CBC, troponins, UA, chest x-ray.  Mild renal insufficiency was noted however electrolytes were normal.  EKG showed bradycardia however this is not atypical for the patient as he is a long-distance runner, running 3 miles daily.  His baseline heart rate typically ranges from 50-54 bpm.  His physical exam today is benign.  Blood pressure stable.  Orthostatic vital signs are negative.  Cardiac exam negative for murmurs.  No carotid bruits noted.  He denies any recurrent syncope.  I have reviewed patient case as well as EKG with Dr. Ladona Ridgel, DOD.  We have recommended an echocardiogram for further evaluation.  The patient will follow up with Dr. Ladona Ridgel thereafter.  There are no symptoms to suggest coronary disease.  He has no exertional dyspnea or chest pain with running.  Follow-up with Dr. Ladona Ridgel after echocardiogram.   Knute Neu, MHS [facility] 07/22/2017 4:56 PM

## 2017-07-22 NOTE — Patient Instructions (Addendum)
Medication Instructions:   Your physician recommends that you continue on your current medications as directed. Please refer to the Current Medication list given to you today.    If you need a refill on your cardiac medications before your next appointment, please call your pharmacy.  Labwork: NONE ORDERED  TODAY   Testing/Procedures: Your physician has requested that you have an echocardiogram. Echocardiography is a painless test that uses sound waves to create images of your heart. It provides your doctor with information about the size and shape of your heart and how well your heart's chambers and valves are working. This procedure takes approximately one hour. There are no restrictions for this procedure.   Follow-Up: WITH DR Ladona RidgelAYLOR NEXT AVAILABLE    Any Other Special Instructions Will Be Listed Below (If Applicable).

## 2017-07-25 ENCOUNTER — Other Ambulatory Visit: Payer: Self-pay

## 2017-07-25 ENCOUNTER — Ambulatory Visit (HOSPITAL_COMMUNITY): Payer: BLUE CROSS/BLUE SHIELD | Attending: Cardiovascular Disease

## 2017-07-25 DIAGNOSIS — I10 Essential (primary) hypertension: Secondary | ICD-10-CM | POA: Insufficient documentation

## 2017-07-25 DIAGNOSIS — R55 Syncope and collapse: Secondary | ICD-10-CM | POA: Diagnosis not present

## 2017-07-25 DIAGNOSIS — E785 Hyperlipidemia, unspecified: Secondary | ICD-10-CM | POA: Diagnosis not present

## 2017-07-30 NOTE — Progress Notes (Signed)
Pt has been made aware of normal result and verbalized understanding.  jw 07/29/17

## 2017-08-08 DIAGNOSIS — D126 Benign neoplasm of colon, unspecified: Secondary | ICD-10-CM | POA: Diagnosis not present

## 2017-08-08 DIAGNOSIS — Z1211 Encounter for screening for malignant neoplasm of colon: Secondary | ICD-10-CM | POA: Diagnosis not present

## 2017-08-08 DIAGNOSIS — K635 Polyp of colon: Secondary | ICD-10-CM | POA: Diagnosis not present

## 2017-08-12 ENCOUNTER — Encounter: Payer: Self-pay | Admitting: Internal Medicine

## 2017-08-12 ENCOUNTER — Ambulatory Visit (INDEPENDENT_AMBULATORY_CARE_PROVIDER_SITE_OTHER): Payer: BLUE CROSS/BLUE SHIELD | Admitting: Internal Medicine

## 2017-08-12 VITALS — BP 136/82 | HR 51 | Ht 65.0 in | Wt 163.4 lb

## 2017-08-12 DIAGNOSIS — D126 Benign neoplasm of colon, unspecified: Secondary | ICD-10-CM | POA: Diagnosis not present

## 2017-08-12 DIAGNOSIS — Z1211 Encounter for screening for malignant neoplasm of colon: Secondary | ICD-10-CM | POA: Diagnosis not present

## 2017-08-12 DIAGNOSIS — R55 Syncope and collapse: Secondary | ICD-10-CM | POA: Diagnosis not present

## 2017-08-12 NOTE — Patient Instructions (Addendum)
Medication Instructions:  Your physician recommends that you continue on your current medications as directed. Please refer to the Current Medication list given to you today.  Labwork: None ordered.  Testing/Procedures: None ordered.  Follow-Up: Your physician wants you to follow-up in: as needed with Dr. Ladona Ridgel.     Any Other Special Instructions Will Be Listed Below (If Applicable).  Stay hydrated. Avoid too much trazodone.  If you need a refill on your cardiac medications before your next appointment, please call your pharmacy.

## 2017-08-12 NOTE — Progress Notes (Signed)
HPI Jeffrey [name] is referred today for evaluation of unexplained syncope. He is a pleasant 52 yo man with a h/o obesity who lost over 60 lbs after starting an exercise program. He has felt well. He has had trouble sleeping and takes trazedone. He took an extra trazadone on the night before he passed out. He awoke and went to the bathroom, was shaving and fell out. No real memory. His wife heard and came in and helping him up. He started back to shave and got dizzy again. By the time the paramedics arrived he was better. No episodes since. Workup demonstrates normal LV function. His ECG is unreavealing except for sinus bradycardia. He has no family history or sudden death or premature CAD. He did not bite his tongue or lose control of his bowel or bladder function.   No Known Allergies   Current Outpatient Medications  Medication Sig Dispense Refill  . Ascorbic Acid (VITAMIN C) 1000 MG tablet Take 1,000 mg by mouth daily.    . Cholecalciferol (VITAMIN D) 2000 UNITS tablet Take 2,000 Units by mouth daily.    . Coenzyme Q10 (COQ10) 30 MG CAPS Take 30 mg by mouth daily.    Marland Kitchen losartan-hydrochlorothiazide (HYZAAR) 50-12.5 MG per tablet Take 1 tablet by mouth daily.    . Magnesium 500 MG TABS Take 500 mg by mouth daily.    . traZODone (DESYREL) 50 MG tablet Take 50 mg by mouth at bedtime as needed for sleep.    . vitamin B-12 (CYANOCOBALAMIN) 500 MCG tablet Take 500 mcg by mouth daily.    . Zinc 50 MG CAPS Take 1 capsule by mouth daily.     No current facility-administered medications for this visit.      Past Medical History:  Diagnosis Date  . Hypercholesteremia   . Hypertension     ROS:   All systems reviewed and negative except as noted in the HPI.   No past surgical history on file.   Family History  Problem Relation Age of Onset  . High blood pressure Father      Social History   Socioeconomic History  . Marital status: Married    Spouse name: Not on file  .  Number of children: Not on file  . Years of education: Not on file  . Highest education level: Not on file  Occupational History  . Not on file  Social Needs  . Financial resource strain: Not on file  . Food insecurity:    Worry: Not on file    Inability: Not on file  . Transportation needs:    Medical: Not on file    Non-medical: Not on file  Tobacco Use  . Smoking status: Never Smoker  . Smokeless tobacco: Never Used  Substance and Sexual Activity  . Alcohol use: Yes    Comment: occ  . Drug use: No  . Sexual activity: Not on file  Lifestyle  . Physical activity:    Days per week: Not on file    Minutes per session: Not on file  . Stress: Not on file  Relationships  . Social connections:    Talks on phone: Not on file    Gets together: Not on file    Attends religious service: Not on file    Active member of club or organization: Not on file    Attends meetings of clubs or organizations: Not on file    Relationship status: Not on file  .  Intimate partner violence:    Fear of current or ex partner: Not on file    Emotionally abused: Not on file    Physically abused: Not on file    Forced sexual activity: Not on file  Other Topics Concern  . Not on file  Social History Narrative  . Not on file     BP 136/82   Pulse (!) 51   Ht 5\' 5"  (1.651 m)   Wt 163 lb 6.4 oz (74.1 kg)   SpO2 97%   BMI 27.19 kg/m   Physical Exam:  Well appearing NAD HEENT: Unremarkable Neck:  No JVD, no thyromegally Lymphatics:  No adenopathy Back:  No CVA tenderness Lungs:  Clear HEART:  Regular rate rhythm, no murmurs, no rubs, no clicks Abd:  soft, positive bowel sounds, no organomegally, no rebound, no guarding Ext:  2 plus pulses, no edema, no cyanosis, no clubbing Skin:  No rashes no nodules Neuro:  CN II through XII intact, motor grossly intact  ECG - reviewed  Assess/Plan: 1. Syncope - I have discussed the etiology of syncope and have recommended a period of watchful  waiting. If/when he has more episodes then he will call us and we might consider insertion of an ILR. I have asked him to take no more than 50 mg of trazadone.   2. HTN - he will continue his current medical therapy. He will remain on his combination losartan/HCTZ for now. If he were to have more episodes then might consider additional adjustment in his meds.  Leonia ReevesGregg Aleczander [name],M.D.

## 2017-09-03 DIAGNOSIS — N644 Mastodynia: Secondary | ICD-10-CM | POA: Diagnosis not present

## 2017-09-19 DIAGNOSIS — H524 Presbyopia: Secondary | ICD-10-CM | POA: Diagnosis not present

## 2017-11-11 DIAGNOSIS — I10 Essential (primary) hypertension: Secondary | ICD-10-CM | POA: Diagnosis not present

## 2017-11-11 DIAGNOSIS — G4700 Insomnia, unspecified: Secondary | ICD-10-CM | POA: Diagnosis not present

## 2017-11-11 DIAGNOSIS — E7800 Pure hypercholesterolemia, unspecified: Secondary | ICD-10-CM | POA: Diagnosis not present

## 2018-01-18 DIAGNOSIS — M25511 Pain in right shoulder: Secondary | ICD-10-CM | POA: Diagnosis not present

## 2018-02-17 DIAGNOSIS — L821 Other seborrheic keratosis: Secondary | ICD-10-CM | POA: Diagnosis not present

## 2018-02-17 DIAGNOSIS — L814 Other melanin hyperpigmentation: Secondary | ICD-10-CM | POA: Diagnosis not present

## 2018-02-17 DIAGNOSIS — D225 Melanocytic nevi of trunk: Secondary | ICD-10-CM | POA: Diagnosis not present

## 2018-05-18 DIAGNOSIS — Z1211 Encounter for screening for malignant neoplasm of colon: Secondary | ICD-10-CM | POA: Diagnosis not present

## 2018-05-18 DIAGNOSIS — Z0000 Encounter for general adult medical examination without abnormal findings: Secondary | ICD-10-CM | POA: Diagnosis not present

## 2018-05-18 DIAGNOSIS — Z125 Encounter for screening for malignant neoplasm of prostate: Secondary | ICD-10-CM | POA: Diagnosis not present

## 2018-05-18 DIAGNOSIS — E7800 Pure hypercholesterolemia, unspecified: Secondary | ICD-10-CM | POA: Diagnosis not present

## 2018-11-16 DIAGNOSIS — E7800 Pure hypercholesterolemia, unspecified: Secondary | ICD-10-CM | POA: Diagnosis not present

## 2018-11-16 DIAGNOSIS — G4700 Insomnia, unspecified: Secondary | ICD-10-CM | POA: Diagnosis not present

## 2019-01-14 IMAGING — CR DG CHEST 2V
2 series · 2 of 2 positions shown · non-contrast
Comparison: 03/17/2014

CLINICAL DATA: Syncopal episode today.

EXAM:
CHEST - 2 VIEW

[chest pa]
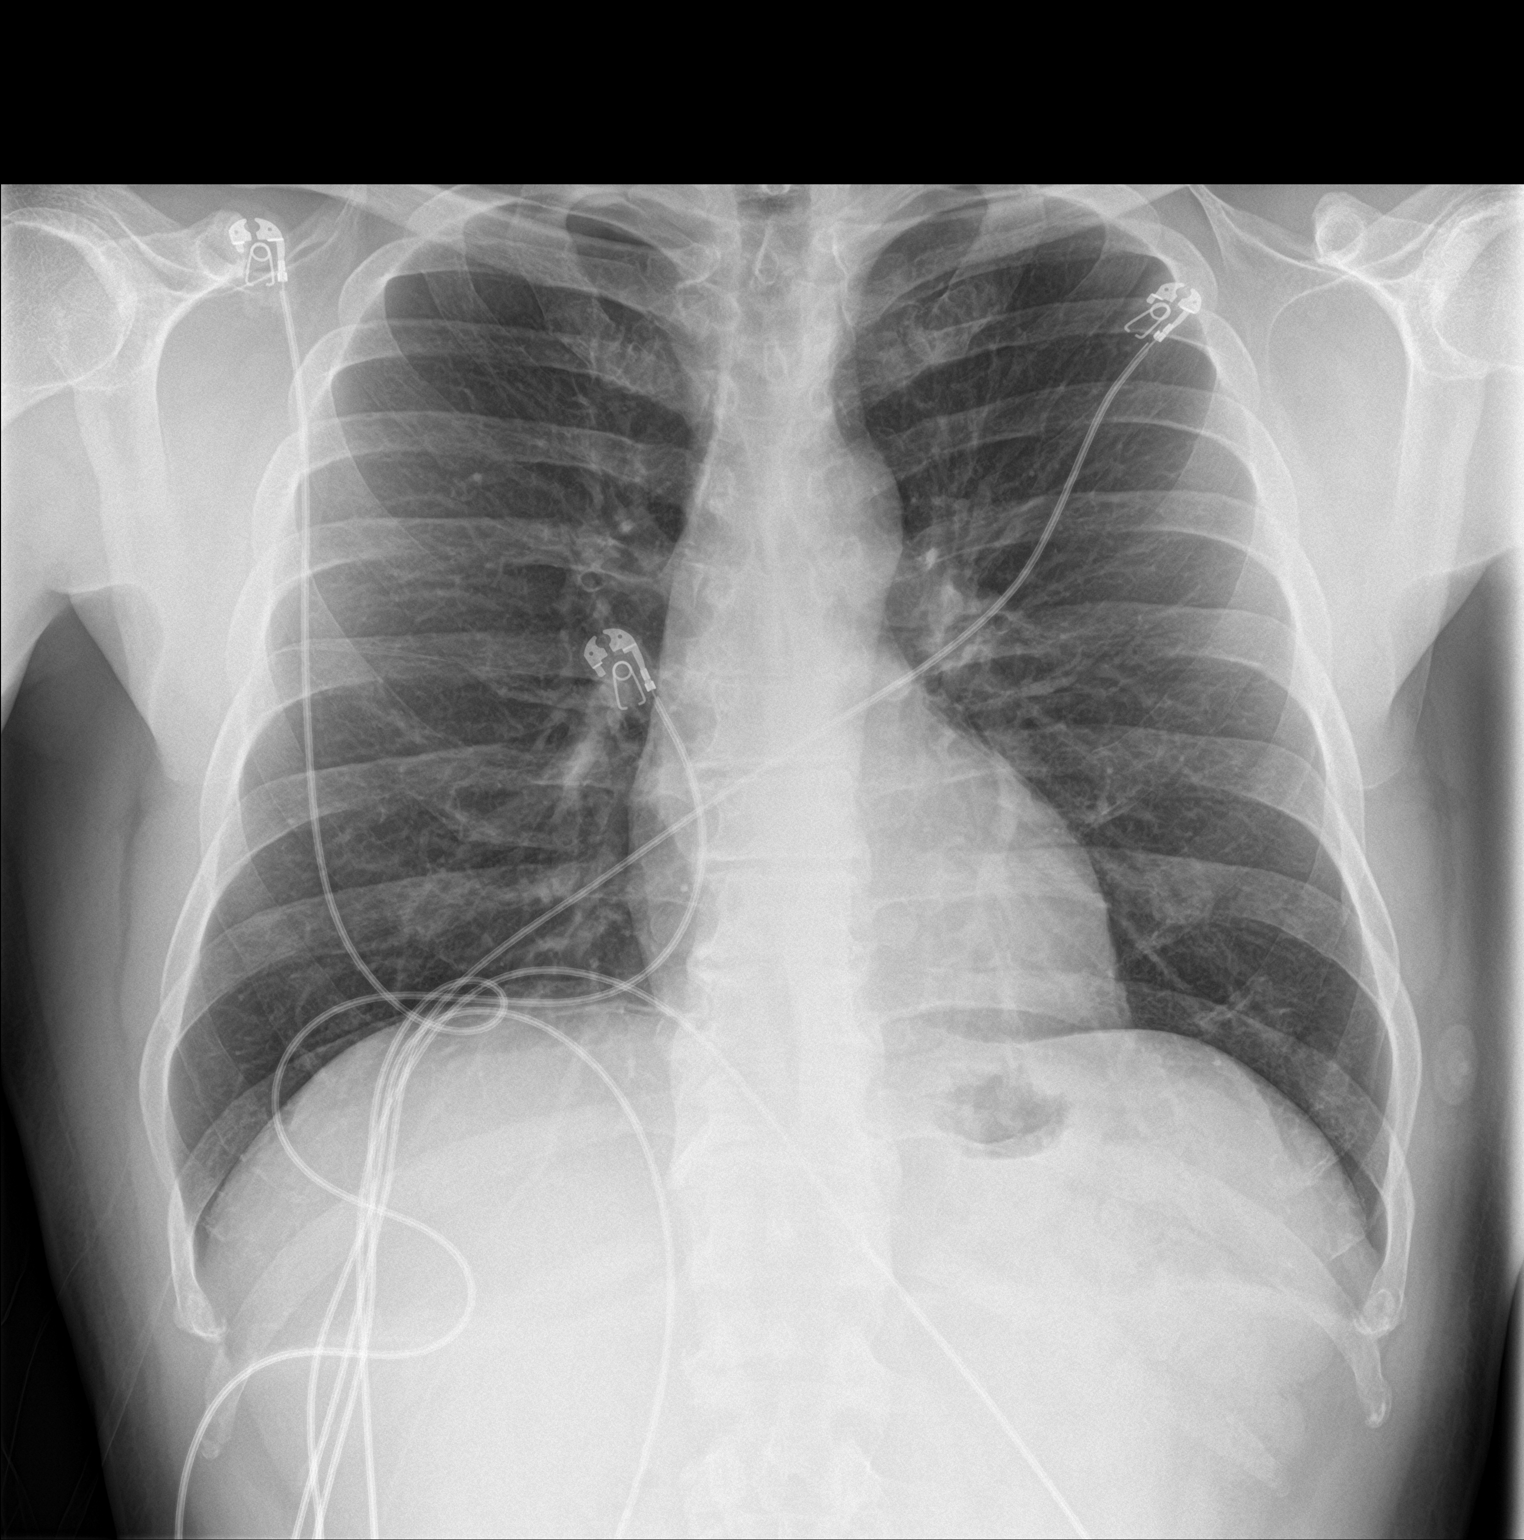

[chest lat]
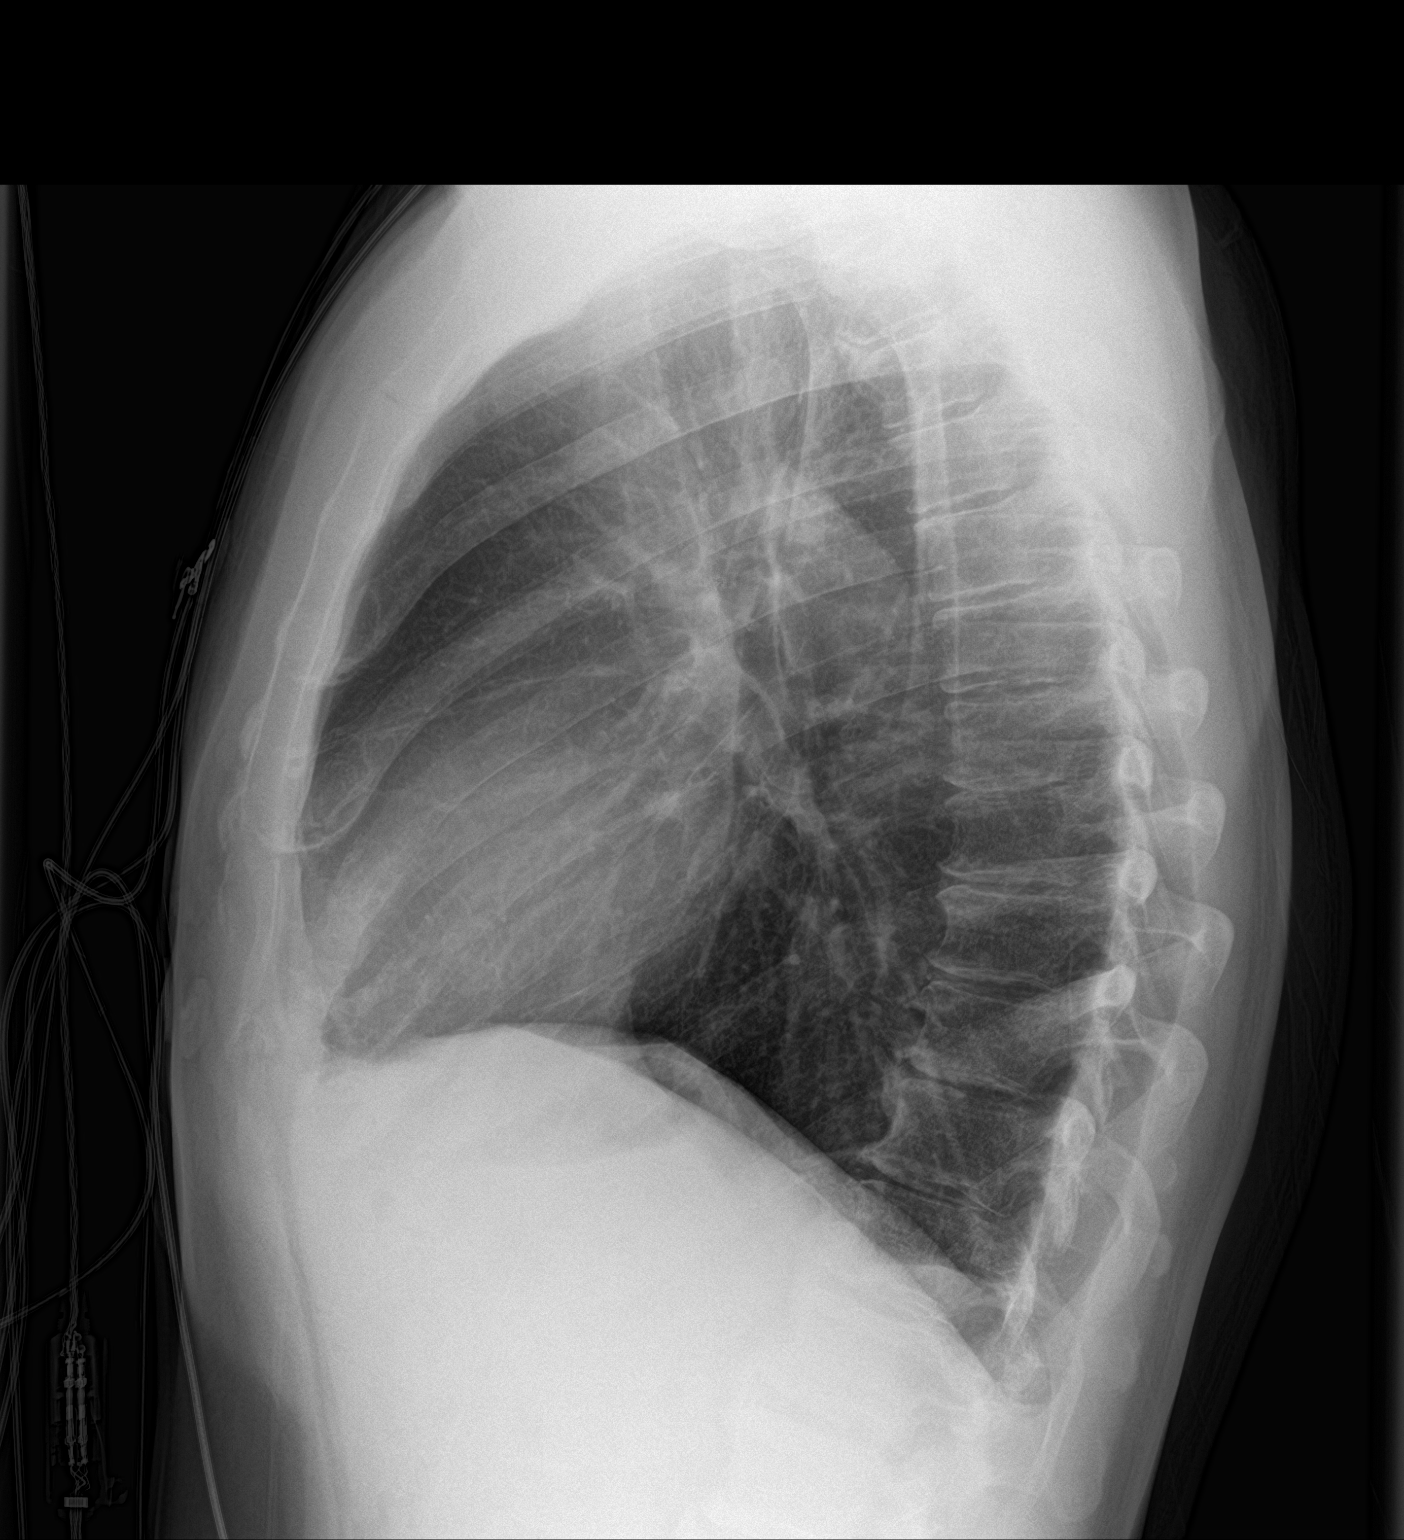

[2 of 2 positions shown; findings below may reference images not displayed]

FINDINGS: Art size is normal. Mediastinal shadows are normal. The lungs are
clear. The vascularity is normal. No effusions. Ordinary
degenerative changes affect the spine. There is newly seen sclerotic
density associated with the anterior left sixth rib, not present on
the previous study. This is quite likely benign and insignificant.
Lead shadows overlie the left lower chest.
IMPRESSION: No active disease.

## 2019-02-17 DIAGNOSIS — L821 Other seborrheic keratosis: Secondary | ICD-10-CM | POA: Diagnosis not present

## 2019-02-17 DIAGNOSIS — D225 Melanocytic nevi of trunk: Secondary | ICD-10-CM | POA: Diagnosis not present

## 2019-02-17 DIAGNOSIS — L814 Other melanin hyperpigmentation: Secondary | ICD-10-CM | POA: Diagnosis not present

## 2019-03-15 ENCOUNTER — Other Ambulatory Visit: Payer: Self-pay

## 2019-03-15 ENCOUNTER — Emergency Department (HOSPITAL_COMMUNITY)
Admission: EM | Admit: 2019-03-15 | Discharge: 2019-03-15 | Disposition: A | Discharge status: Home / Self Care | Payer: BC Managed Care – PPO | Attending: Emergency Medicine | Admitting: Emergency Medicine

## 2019-03-15 ENCOUNTER — Encounter (HOSPITAL_COMMUNITY): Payer: Self-pay | Admitting: Emergency Medicine

## 2019-03-15 DIAGNOSIS — Y939 Activity, unspecified: Secondary | ICD-10-CM | POA: Insufficient documentation

## 2019-03-15 DIAGNOSIS — I10 Essential (primary) hypertension: Secondary | ICD-10-CM | POA: Diagnosis not present

## 2019-03-15 DIAGNOSIS — Z79899 Other long term (current) drug therapy: Secondary | ICD-10-CM | POA: Diagnosis not present

## 2019-03-15 DIAGNOSIS — T18128A Food in esophagus causing other injury, initial encounter: Secondary | ICD-10-CM | POA: Insufficient documentation

## 2019-03-15 DIAGNOSIS — Y999 Unspecified external cause status: Secondary | ICD-10-CM | POA: Diagnosis not present

## 2019-03-15 DIAGNOSIS — X58XXXA Exposure to other specified factors, initial encounter: Secondary | ICD-10-CM | POA: Insufficient documentation

## 2019-03-15 DIAGNOSIS — Y92019 Unspecified place in single-family (private) house as the place of occurrence of the external cause: Secondary | ICD-10-CM | POA: Diagnosis not present

## 2019-03-15 DIAGNOSIS — T18108A Unspecified foreign body in esophagus causing other injury, initial encounter: Secondary | ICD-10-CM | POA: Diagnosis not present

## 2019-03-15 NOTE — ED Triage Notes (Signed)
Pt here from home with c/o a pancake stuck in his throat , pt has this happen about 20 years ago , pt unable to drink water

## 2019-03-15 NOTE — Discharge Instructions (Signed)
Please eat a soft diet for the next week.  Please call the GI doctor today and see when they went to see you in the office.  They may want to do a swallow study or they may want to take a look at your esophagus with a camera. Please return if you are unable to swallow liquids.

## 2019-03-15 NOTE — ED Provider Notes (Signed)
[facility] Provider Note   CSN: [id number] Arrival date & time: 03/15/19  2774     History   Chief Complaint Chief Complaint  Patient presents with  . Sore Throat    food stuck in throat    HPI Jeffrey [name] is a 53 y.o. male.     53 yo M with a chief complaint of inability to swallow.  The patient was eating breakfast this morning and he had some pancake.  Felt like he got stuck in his throat.  No coughing or shortness of breath.  Was unable to tolerate even his own secretions.  He had a problem like this about 20 years ago with so it was due to his morbid obesity and a hiatal hernia.  Had to have endoscopic intervention at that time.  Prior to my arrival into the room the patient felt that the food spontaneously passed.  He is able to tolerate liquids without issue.  Denies any pain denies shortness of breath.  The history is provided by the patient.  Sore Throat Pertinent negatives include no chest pain, no abdominal pain, no headaches and no shortness of breath.  Illness Severity:  Moderate Onset quality:  Gradual Duration:  2 hours Timing:  Constant Progression:  Resolved Chronicity:  New Associated symptoms: no abdominal pain, no chest pain, no congestion, no diarrhea, no fever, no headaches, no myalgias, no rash, no shortness of breath and no vomiting     Past Medical History:  Diagnosis Date  . Hypercholesteremia   . Hypertension     Patient Active Problem List   Diagnosis Date Noted  . Essential hypertension 03/30/2014  . Overweight 03/30/2014  . Hyperlipidemia 03/30/2014    History reviewed. No pertinent surgical history.      Home Medications    Prior to Admission medications   Medication Sig Start Date End Date Taking? Authorizing Provider  Ascorbic Acid (VITAMIN C) 1000 MG tablet Take 1,000 mg by mouth daily.    [provider]  Cholecalciferol (VITAMIN D) 2000 UNITS tablet Take 2,000 Units by  mouth daily.    [provider]  Coenzyme Q10 (COQ10) 30 MG CAPS Take 30 mg by mouth daily.    [provider]  losartan-hydrochlorothiazide (HYZAAR) 50-12.5 MG per tablet Take 1 tablet by mouth daily.    [provider]  Magnesium 500 MG TABS Take 500 mg by mouth daily.    [provider]  traZODone (DESYREL) 50 MG tablet Take 50 mg by mouth at bedtime as needed for sleep.    [provider]  vitamin B-12 (CYANOCOBALAMIN) 500 MCG tablet Take 500 mcg by mouth daily.    [provider]  Zinc 50 MG CAPS Take 1 capsule by mouth daily.    [provider]    Family History Family History  Problem Relation Age of Onset  . High blood pressure Father     Social History Social History   Tobacco Use  . Smoking status: Never Smoker  . Smokeless tobacco: Never Used  Substance Use Topics  . Alcohol use: Yes    Comment: occ  . Drug use: No     Allergies   Patient has no known allergies.   Review of Systems Review of Systems  Constitutional: Negative for chills and fever.  HENT: Positive for drooling and trouble swallowing. Negative for congestion and facial swelling.   Eyes: Negative for discharge and visual disturbance.  Respiratory: Negative for shortness of  breath.   Cardiovascular: Negative for chest pain and palpitations.  Gastrointestinal: Negative for abdominal pain, diarrhea and vomiting.  Musculoskeletal: Negative for arthralgias and myalgias.  Skin: Negative for color change and rash.  Neurological: Negative for tremors, syncope and headaches.  Psychiatric/Behavioral: Negative for confusion and dysphoric mood.     Physical Exam Updated Vital Signs BP (!) 142/85 (BP Location: Left Arm)   Pulse (!) 53   Resp 15   SpO2 100%   Physical Exam Vitals signs and nursing note reviewed.  Constitutional:      Appearance: He is well-developed.  HENT:     Head: Normocephalic and atraumatic.  Eyes:     Pupils:  Pupils are equal, round, and reactive to light.  Neck:     Musculoskeletal: Normal range of motion and neck supple.     Vascular: No JVD.  Cardiovascular:     Rate and Rhythm: Normal rate and regular rhythm.     Heart sounds: No murmur. No friction rub. No gallop.   Pulmonary:     Effort: No respiratory distress.     Breath sounds: No wheezing.  Abdominal:     General: There is no distension.     Tenderness: There is no guarding or rebound.  Musculoskeletal: Normal range of motion.  Skin:    Coloration: Skin is not pale.     Findings: No rash.  Neurological:     Mental Status: He is alert and oriented to person, place, and time.  Psychiatric:        Behavior: Behavior normal.      ED Treatments / Results  Labs (all labs ordered are listed, but only abnormal results are displayed) Labs Reviewed - No data to display  EKG None  Radiology No results found.  Procedures Procedures (including critical care time)  Medications Ordered in ED Medications - No data to display   Initial Impression / Assessment and Plan / ED Course  I have reviewed the triage vital signs and the nursing notes.  Pertinent labs & imaging results that were available during my care of the patient were reviewed by me and considered in my medical decision making (see chart for details).        53 yo M with having difficulty swallowing.  Patient likely has a esophageal food bolus.  This likely he was able to pass on his own.  At this time he is tolerating liquids without issue.  We will give him GI follow-up.  PCP follow-up as well.  Soft diet.  9:22 AM:  I have discussed the diagnosis/risks/treatment options with the patient and believe the pt to be eligible for discharge home to follow-up with PCP, GI. We also discussed returning to the ED immediately if new or worsening sx occur. We discussed the sx which are most concerning (e.g., sudden worsening pain, fever, inability to tolerate by mouth) that  necessitate immediate return. Medications administered to the patient during their visit and any new prescriptions provided to the patient are listed below.  Medications given during this visit Medications - No data to display   The patient appears reasonably screen and/or stabilized for discharge and I doubt any other medical condition or other [facility] requiring further screening, evaluation, or treatment in the ED at this time prior to discharge.    Final Clinical Impressions(s) / ED Diagnoses   Final diagnoses:  Esophageal foreign body, initial encounter    ED Discharge Orders    None  Melene PlanFloyd, Surabhi Gadea, DO 03/15/19 [phone]

## 2019-07-14 DIAGNOSIS — Z0000 Encounter for general adult medical examination without abnormal findings: Secondary | ICD-10-CM | POA: Diagnosis not present

## 2019-07-14 DIAGNOSIS — G4700 Insomnia, unspecified: Secondary | ICD-10-CM | POA: Diagnosis not present

## 2019-07-14 DIAGNOSIS — E7800 Pure hypercholesterolemia, unspecified: Secondary | ICD-10-CM | POA: Diagnosis not present

## 2019-07-14 DIAGNOSIS — Z125 Encounter for screening for malignant neoplasm of prostate: Secondary | ICD-10-CM | POA: Diagnosis not present

## 2019-12-13 DIAGNOSIS — I10 Essential (primary) hypertension: Secondary | ICD-10-CM | POA: Diagnosis not present

## 2020-02-28 DIAGNOSIS — L821 Other seborrheic keratosis: Secondary | ICD-10-CM | POA: Diagnosis not present

## 2020-02-28 DIAGNOSIS — D225 Melanocytic nevi of trunk: Secondary | ICD-10-CM | POA: Diagnosis not present

## 2020-02-28 DIAGNOSIS — L578 Other skin changes due to chronic exposure to nonionizing radiation: Secondary | ICD-10-CM | POA: Diagnosis not present

## 2020-02-28 DIAGNOSIS — L814 Other melanin hyperpigmentation: Secondary | ICD-10-CM | POA: Diagnosis not present

## 2020-04-17 DIAGNOSIS — I10 Essential (primary) hypertension: Secondary | ICD-10-CM | POA: Diagnosis not present

## 2020-04-17 DIAGNOSIS — E7800 Pure hypercholesterolemia, unspecified: Secondary | ICD-10-CM | POA: Diagnosis not present

## 2023-05-28 ENCOUNTER — Other Ambulatory Visit (HOSPITAL_BASED_OUTPATIENT_CLINIC_OR_DEPARTMENT_OTHER): Payer: Self-pay | Admitting: Family Medicine

## 2023-05-28 DIAGNOSIS — E7800 Pure hypercholesterolemia, unspecified: Secondary | ICD-10-CM

## 2023-07-02 ENCOUNTER — Ambulatory Visit (HOSPITAL_BASED_OUTPATIENT_CLINIC_OR_DEPARTMENT_OTHER)
Admission: RE | Admit: 2023-07-02 | Discharge: 2023-07-02 | Disposition: A | Discharge status: Home / Self Care | Payer: Self-pay | Source: Ambulatory Visit | Attending: Family Medicine | Admitting: Family Medicine

## 2023-07-02 DIAGNOSIS — E7800 Pure hypercholesterolemia, unspecified: Secondary | ICD-10-CM | POA: Insufficient documentation

## 2023-12-27 ENCOUNTER — Other Ambulatory Visit: Payer: Self-pay | Admitting: Medical Genetics

## 2024-02-18 ENCOUNTER — Other Ambulatory Visit (HOSPITAL_COMMUNITY)
Admission: RE | Admit: 2024-02-18 | Discharge: 2024-02-18 | Disposition: A | Discharge status: Home / Self Care | Payer: Self-pay | Source: Ambulatory Visit | Attending: Medical Genetics | Admitting: Medical Genetics

## 2024-02-29 LAB — GENECONNECT MOLECULAR SCREEN: Genetic Analysis Overall Interpretation: NEGATIVE
